# Patient Record
Sex: Male | Born: 1965 | Race: White | Hispanic: No | Marital: Single | Smoking: Never smoker
Health system: Southern US, Community
[De-identification: ages and names within clinical notes are randomized; demographics above are authoritative.]

---

## 2014-05-31 ENCOUNTER — Emergency Department (HOSPITAL_COMMUNITY): Payer: Managed Care, Other (non HMO)

## 2014-05-31 ENCOUNTER — Encounter (HOSPITAL_COMMUNITY): Payer: Self-pay | Admitting: Emergency Medicine

## 2014-05-31 ENCOUNTER — Emergency Department (HOSPITAL_COMMUNITY)
Admission: EM | Admit: 2014-05-31 | Discharge: 2014-05-31 | Disposition: A | Discharge status: Home / Self Care | Payer: Managed Care, Other (non HMO) | Attending: Emergency Medicine | Admitting: Emergency Medicine

## 2014-05-31 DIAGNOSIS — R0602 Shortness of breath: Secondary | ICD-10-CM | POA: Insufficient documentation

## 2014-05-31 DIAGNOSIS — R0989 Other specified symptoms and signs involving the circulatory and respiratory systems: Secondary | ICD-10-CM | POA: Insufficient documentation

## 2014-05-31 DIAGNOSIS — R209 Unspecified disturbances of skin sensation: Secondary | ICD-10-CM | POA: Insufficient documentation

## 2014-05-31 DIAGNOSIS — R0789 Other chest pain: Secondary | ICD-10-CM | POA: Insufficient documentation

## 2014-05-31 DIAGNOSIS — R0609 Other forms of dyspnea: Secondary | ICD-10-CM | POA: Insufficient documentation

## 2014-05-31 DIAGNOSIS — R202 Paresthesia of skin: Secondary | ICD-10-CM

## 2014-05-31 DIAGNOSIS — Z7982 Long term (current) use of aspirin: Secondary | ICD-10-CM | POA: Insufficient documentation

## 2014-05-31 LAB — BASIC METABOLIC PANEL
ANION GAP: 13 (ref 5–15)
BUN: 12 mg/dL (ref 6–23)
CHLORIDE: 102 meq/L (ref 96–112)
CO2: 27 meq/L (ref 19–32)
Calcium: 9 mg/dL (ref 8.4–10.5)
Creatinine, Ser: 0.82 mg/dL (ref 0.50–1.35)
GFR calc Af Amer: >90 mL/min (ref >90)
GFR calc non Af Amer: >90 mL/min (ref >90)
Glucose, Bld: 95 mg/dL (ref 70–99)
Potassium: 3.5 mEq/L — ABNORMAL LOW (ref 3.7–5.3)
SODIUM: 142 meq/L (ref 137–147)

## 2014-05-31 LAB — CBC
HCT: 40.5 % (ref 39.0–52.0)
HEMOGLOBIN: 14 g/dL (ref 13.0–17.0)
MCH: 30.3 pg (ref 26.0–34.0)
MCHC: 34.6 g/dL (ref 30.0–36.0)
MCV: 87.7 fL (ref 78.0–100.0)
PLATELETS: 176 10*3/uL (ref 150–400)
RBC: 4.62 MIL/uL (ref 4.22–5.81)
RDW: 13.2 % (ref 11.5–15.5)
WBC: 5.4 10*3/uL (ref 4.0–10.5)

## 2014-05-31 LAB — I-STAT TROPONIN, ED
TROPONIN I, POC: 0 ng/mL (ref 0.00–0.08)
TROPONIN I, POC: 0 ng/mL (ref 0.00–0.08)

## 2014-05-31 LAB — PRO B NATRIURETIC PEPTIDE: PRO B NATRI PEPTIDE: 28.4 pg/mL (ref 0–125)

## 2014-05-31 NOTE — ED Notes (Signed)
Patient ambulatory with no apparent difficulty

## 2014-05-31 NOTE — ED Notes (Signed)
He states for the past 10 days hes had intermittent CP, trouble breathing, and "pins and needles tingling in my arms." He scheduled appt to see cardiologist next week but states he is worried because he continues to have the symptoms

## 2014-05-31 NOTE — ED Notes (Addendum)
2 weeks, patient thought he was having heart attack, sweating/ chills.  Drove to ER, pain had stopped by arrival so did not seek care.  Over the past 2 weeks intermittent chest pain and sob.  Last night woke up with sob, pins and needles in left arm, with chest pain - mid/left sternum.  Lasted 4 hours.

## 2014-05-31 NOTE — ED Notes (Signed)
PA student at bedside.

## 2014-05-31 NOTE — Discharge Instructions (Signed)
Please continue taking your daily aspirin. Avoid exertional exercise until you see the cardiologist. Your caregiver has diagnosed you as having chest pain that is not specific for one problem, but does not require admission.  You are at low risk for an acute heart condition or other serious illness. Chest pain comes from many different causes.  SEEK IMMEDIATE MEDICAL ATTENTION IF: You have severe chest pain, especially if the pain is crushing or pressure-like and spreads to the arms, back, neck, or jaw, or if you have sweating, nausea (feeling sick to your stomach), or shortness of breath. THIS IS AN EMERGENCY. Don't wait to see if the pain will go away. Get medical help at once. Call 911 or 0 (operator). DO NOT drive yourself to the hospital.  Your chest pain gets worse and does not go away with rest.  You have an attack of chest pain lasting longer than usual, despite rest and treatment with the medications your caregiver has prescribed.  You wake from sleep with chest pain or shortness of breath.  You feel dizzy or faint.  You have chest pain not typical of your usual pain for which you originally saw your caregiver.   Chest Pain (Nonspecific) It is often hard to give a specific diagnosis for the cause of chest pain. There is always a chance that your pain could be related to something serious, such as a heart attack or a blood clot in the lungs. You need to follow up with your health care provider for further evaluation. CAUSES   Heartburn.  Pneumonia or bronchitis.  Anxiety or stress.  Inflammation around your heart (pericarditis) or lung (pleuritis or pleurisy).  A blood clot in the lung.  A collapsed lung (pneumothorax). It can develop suddenly on its own (spontaneous pneumothorax) or from trauma to the chest.  Shingles infection (herpes zoster virus). The chest wall is composed of bones, muscles, and cartilage. Any of these can be the source of the pain.  The bones can be  bruised by injury.  The muscles or cartilage can be strained by coughing or overwork.  The cartilage can be affected by inflammation and become sore (costochondritis). DIAGNOSIS  Lab tests or other studies may be needed to find the cause of your pain. Your health care provider may have you take a test called an ambulatory electrocardiogram (ECG). An ECG records your heartbeat patterns over a 24-hour period. You may also have other tests, such as:  Transthoracic echocardiogram (TTE). During echocardiography, sound waves are used to evaluate how blood flows through your heart.  Transesophageal echocardiogram (TEE).  Cardiac monitoring. This allows your health care provider to monitor your heart rate and rhythm in real time.  Holter monitor. This is a portable device that records your heartbeat and can help diagnose heart arrhythmias. It allows your health care provider to track your heart activity for several days, if needed.  Stress tests by exercise or by giving medicine that makes the heart beat faster. TREATMENT   Treatment depends on what may be causing your chest pain. Treatment may include:  Acid blockers for heartburn.  Anti-inflammatory medicine.  Pain medicine for inflammatory conditions.  Antibiotics if an infection is present.  You may be advised to change lifestyle habits. This includes stopping smoking and avoiding alcohol, caffeine, and chocolate.  You may be advised to keep your head raised (elevated) when sleeping. This reduces the chance of acid going backward from your stomach into your esophagus. Most of the time, nonspecific chest  pain will improve within 2-3 days with rest and mild pain medicine.  HOME CARE INSTRUCTIONS   If antibiotics were prescribed, take them as directed. Finish them even if you start to feel better.  For the next few days, avoid physical activities that bring on chest pain. Continue physical activities as directed.  Do not use any  tobacco products, including cigarettes, chewing tobacco, or electronic cigarettes.  Avoid drinking alcohol.  Only take medicine as directed by your health care provider.  Follow your health care provider's suggestions for further testing if your chest pain does not go away.  Keep any follow-up appointments you made. If you do not go to an appointment, you could develop lasting (chronic) problems with pain. If there is any problem keeping an appointment, call to reschedule. SEEK MEDICAL CARE IF:   Your chest pain does not go away, even after treatment.  You have a rash with blisters on your chest.  You have a fever. SEEK IMMEDIATE MEDICAL CARE IF:   You have increased chest pain or pain that spreads to your arm, neck, jaw, back, or abdomen.  You have shortness of breath.  You have an increasing cough, or you cough up blood.  You have severe back or abdominal pain.  You feel nauseous or vomit.  You have severe weakness.  You faint.  You have chills. This is an emergency. Do not wait to see if the pain will go away. Get medical help at once. Call your local emergency services (911 in [place] Do not drive yourself to the hospital. MAKE SURE YOU:   Understand these instructions.  Will watch your condition.  Will get help right away if you are not doing well or get worse. Document Released: 08/27/2005 Document Revised: 11/22/2013 Document Reviewed: 06/22/2008 [facility] Patient Information 2015 [place], [place]. This information is not intended to replace advice given to you by your health care provider. Make sure you discuss any questions you have with your health care provider.  Paresthesia Paresthesia is an abnormal burning or prickling sensation. This sensation is generally felt in the hands, arms, legs, or feet. However, it may occur in any part of the body. It is usually not painful. The feeling may be described as:  Tingling or numbness.  "Pins and needles."  Skin  crawling.  Buzzing.  Limbs "falling asleep."  Itching. Most people experience temporary (transient) paresthesia at some time in their lives. CAUSES  Paresthesia may occur when you breathe too quickly (hyperventilation). It can also occur without any apparent cause. Commonly, paresthesia occurs when pressure is placed on a nerve. The feeling quickly goes away once the pressure is removed. For some people, however, paresthesia is a long-lasting (chronic) condition caused by an underlying disorder. The underlying disorder may be:  A traumatic, direct injury to nerves. Examples include a:  Broken (fractured) neck.  Fractured skull.  A disorder affecting the brain and spinal cord (central nervous system). Examples include:  Transverse myelitis.  Encephalitis.  Transient ischemic attack.  Multiple sclerosis.  Stroke.  Tumor or blood vessel problems, such as an arteriovenous malformation pressing against the brain or spinal cord.  A condition that damages the peripheral nerves (peripheral neuropathy). Peripheral nerves are not part of the brain and spinal cord. These conditions include:  Diabetes.  Peripheral vascular disease.  Nerve entrapment syndromes, such as carpal tunnel syndrome.  Shingles.  Hypothyroidism.  Vitamin B12 deficiencies.  Alcoholism.  Heavy metal poisoning (lead, arsenic).  Rheumatoid arthritis.  Systemic lupus erythematosus.  DIAGNOSIS  Your caregiver will attempt to find the underlying cause of your paresthesia. Your caregiver may:  Take your medical history.  Perform a physical exam.  Order various lab tests.  Order imaging tests. TREATMENT  Treatment for paresthesia depends on the underlying cause. HOME CARE INSTRUCTIONS  Avoid drinking alcohol.  You may consider massage or acupuncture to help relieve your symptoms.  Keep all follow-up appointments as directed by your caregiver. SEEK IMMEDIATE MEDICAL CARE IF:   You feel  weak.  You have trouble walking or moving.  You have problems with speech or vision.  You feel confused.  You cannot control your bladder or bowel movements.  You feel numbness after an injury.  You faint.  Your burning or prickling feeling gets worse when walking.  You have pain, cramps, or dizziness.  You develop a rash. MAKE SURE YOU:  Understand these instructions.  Will watch your condition.  Will get help right away if you are not doing well or get worse. Document Released: 11/07/2002 Document Revised: 02/09/2012 Document Reviewed: 08/08/2011 [facility] Patient Information 2015 [place]. This information is not intended to replace advice given to you by your health care provider. Make sure you discuss any questions you have with your health care provider.

## 2014-05-31 NOTE — ED Provider Notes (Signed)
CSN: [id number]     Arrival date & time 05/31/14  1433 History   First MD Initiated Contact with Patient 05/31/14 1819     Chief Complaint  Patient presents with  . Chest Pain    George Rosales is a 48 y.o. male who [facility] with the chief complaint of Chest pressure and SOB.  The chest pressure started two weeks ago. He had an episode at work lasting one hour with  associated with hot and cold sweats and dyspnea at rest. He went to an urgent cae, but his sxs resolved and he left before being seen.   [facility] as he  Has had continued chest pressure that is fleeting, lasting about 10 seconds at a time with no associated sxs.  His PCP was concerned about LVH and referred him to a cardiologist. [place]. The patient then had another episode of chest tightness last night around 12:00AM that woke him form sleep. He describes the episode of chest pressure as sudden onset and feels like a band around his chest.. He has occasional sharp chest pain that is fleeting.   He then states he had left hand and forearm paresthesia for the next 8 hours involving all of the digits..  Symptons have not returned from this morning.  No notable PMH. No other cardio risk factors.  He currently takes 1 Asprin daily. Patient is otherwise very active and runs 5 kilometers daily and has recently increased his pushups.  Patient is a 48 y.o. male presenting with chest pain.  Chest Pain Associated symptoms: shortness of breath   Associated symptoms: no abdominal pain, no back pain, no cough, no fever, no headache, no nausea, no palpitations and not vomiting     History reviewed. No pertinent past medical history. History reviewed. No pertinent past surgical history. History reviewed. No pertinent family history. History  Substance Use Topics  . Smoking status: Never Smoker   . Smokeless tobacco: Not on file  . Alcohol  Use: No    Review of Systems  Constitutional: Negative for fever and chills.  Respiratory: Positive for chest tightness and shortness of breath. Negative for cough, choking and wheezing.   Cardiovascular: Positive for chest pain. Negative for palpitations and leg swelling.  Gastrointestinal: Negative for nausea, vomiting, abdominal pain, diarrhea and constipation.  Genitourinary: Negative for dysuria, urgency and frequency.  Musculoskeletal: Negative for arthralgias, back pain, myalgias and neck pain.  Skin: Negative for rash.  Neurological: Negative for headaches.  All other systems reviewed and are negative.     Allergies  Review of patient's allergies indicates no known allergies.  Home Medications   Prior to Admission medications   Medication Sig Start Date End Date Taking? Authorizing Provider  aspirin 81 MG tablet Take 81 mg by mouth daily.   Yes Historical Provider, MD  bismuth subsalicylate (PEPTO BISMOL) 262 MG/15ML suspension Take 30 mLs by mouth every 6 (six) hours as needed for indigestion.   Yes Historical Provider, MD  DM-Doxylamine-Acetaminophen (NYQUIL COLD & FLU PO) Take 1 capsule by mouth at bedtime as needed (sleeping).   Yes Historical Provider, MD   BP 150/102  Pulse 49  Temp(Src) 97.4 F (36.3 C) (Oral)  Resp 15  Ht 5\' 7"  (1.702 m)  Wt 160 lb (72.576 kg)  BMI 25.05 kg/m2  SpO2 100% Physical Exam  Nursing note and vitals reviewed. Constitutional: He appears well-developed  and well-nourished. No distress.  HENT:  Head: Normocephalic and atraumatic.  Eyes: Conjunctivae are normal. No scleral icterus.  Neck: Normal range of motion. Neck supple.  Cardiovascular: Normal rate, regular rhythm and intact distal pulses.  Exam reveals no gallop and no friction rub.   No murmur heard. Pulmonary/Chest: Effort normal and breath sounds normal. No respiratory distress. He exhibits no tenderness.  Abdominal: Soft. There is no tenderness.  Musculoskeletal: He  exhibits no edema.  Neurological: He is alert.  Skin: Skin is warm and dry. He is not diaphoretic.  Psychiatric: His behavior is normal.    ED Course  Procedures (including critical care time) Labs Review Labs Reviewed  BASIC METABOLIC PANEL - Abnormal; Notable for the following:    Potassium 3.5 (*)    All other components within normal limits  CBC  PRO B NATRIURETIC PEPTIDE  I-STAT TROPOININ, ED  Rosezena SensorI-STAT TROPOININ, ED    Imaging Review Dg Chest 2 View  05/31/2014   CLINICAL DATA:  Chest pain and difficulty breathing  EXAM: CHEST  2 VIEW  COMPARISON:  None.  FINDINGS: Lungs are clear. Heart size and pulmonary vascularity are normal. No pneumothorax. No adenopathy. No bone lesions.  IMPRESSION: No abnormality noted.   Electronically Signed   By: Bretta BangWilliam  Woodruff M.D.   On: 05/31/2014 15:16     EKG Interpretation   Date/Time:  Wednesday May 31 2014 14:36:47 EDT Ventricular Rate:  54 PR Interval:  160 QRS Duration: 92 QT Interval:  430 QTC Calculation: 407 R Axis:   65 Text Interpretation:  Sinus bradycardia Possible Left atrial enlargement  Left ventricular hypertrophy Nonspecific T wave abnormality Abnormal ECG  Confirmed by POLLINA  MD, CHRISTOPHER [phone]([id number]) on 05/31/2014 7:57:55 PM      MDM   Final diagnoses:  Chest tightness or pressure  Right hand paresthesia    8:55 PM BP 116/71  Pulse 48  Temp(Src) 97.4 F (36.3 C) (Oral)  Resp 18  Ht 5\' 7"  (1.702 m)  Wt 160 lb (72.576 kg)  BMI 25.05 kg/m2  SpO2 97% Patient seen ins shared visit with Dr. Blinda LeatherwoodPollina.  No chest pressure, sob, n/v.  EKG unremarkable. 2 negative troponins here in the ED  HEART score 1. ACS unlikely . No pleurisy, hemoptysis, desaturations and PE highly doubtful. Exam in ED reassuring.   Patient is to be discharged with recommendation to follow up with PCP in regards to today's hospital visit. Chest pain is not likely of cardiac or pulmonary etiology d/t presentation, perc negative, VSS, no  tracheal deviation, no JVD or new murmur, RRR, breath sounds equal bilaterally, EKG without acute abnormalities, negative troponin, and negative CXR. Pt has  to the ED if sxs becomes exertional, associated with diaphoresis or nausea, or if sxs worsen or becomes concerning in any way. Pt appears reliable for follow up and is agreeable to discharge.        Arthor Captainbigail Ahlana Slaydon, PA-C 06/02/14 1151

## 2014-06-03 NOTE — ED Provider Notes (Signed)
Medical screening examination/treatment/procedure(s) were conducted as a shared visit with non-physician practitioner(s) and myself.  I personally evaluated the patient during the encounter.   EKG Interpretation   Date/Time:  Wednesday May 31 2014 14:36:47 EDT Ventricular Rate:  54 PR Interval:  160 QRS Duration: 92 QT Interval:  430 QTC Calculation: 407 R Axis:   65 Text Interpretation:  Sinus bradycardia Possible Left atrial enlargement  Left ventricular hypertrophy Nonspecific T wave abnormality Abnormal ECG  Confirmed by POLLINA  MD, CHRISTOPHER ([id number]) on 05/31/2014 7:57:55 PM      Presented with chest pain. Patient's pain was atypical, it occurred contrast. Patient is a runner, runs many miles per week and never has exertional chest pain. It is felt that after 2 negative troponins, normal EKG, he is appropriate for outpatient followup. He has already scheduled followup with cardiology for next week. Patient counseled to return for any recurrent pain.  Gilda Creasehristopher J. Pollina, MD 06/03/14 [phone]

## 2015-07-19 IMAGING — CR DG CHEST 2V
2 series · 2 of 2 positions shown · non-contrast
Comparison: None.

CLINICAL DATA: Chest pain and difficulty breathing

EXAM:
CHEST  2 VIEW

[w chest pa]
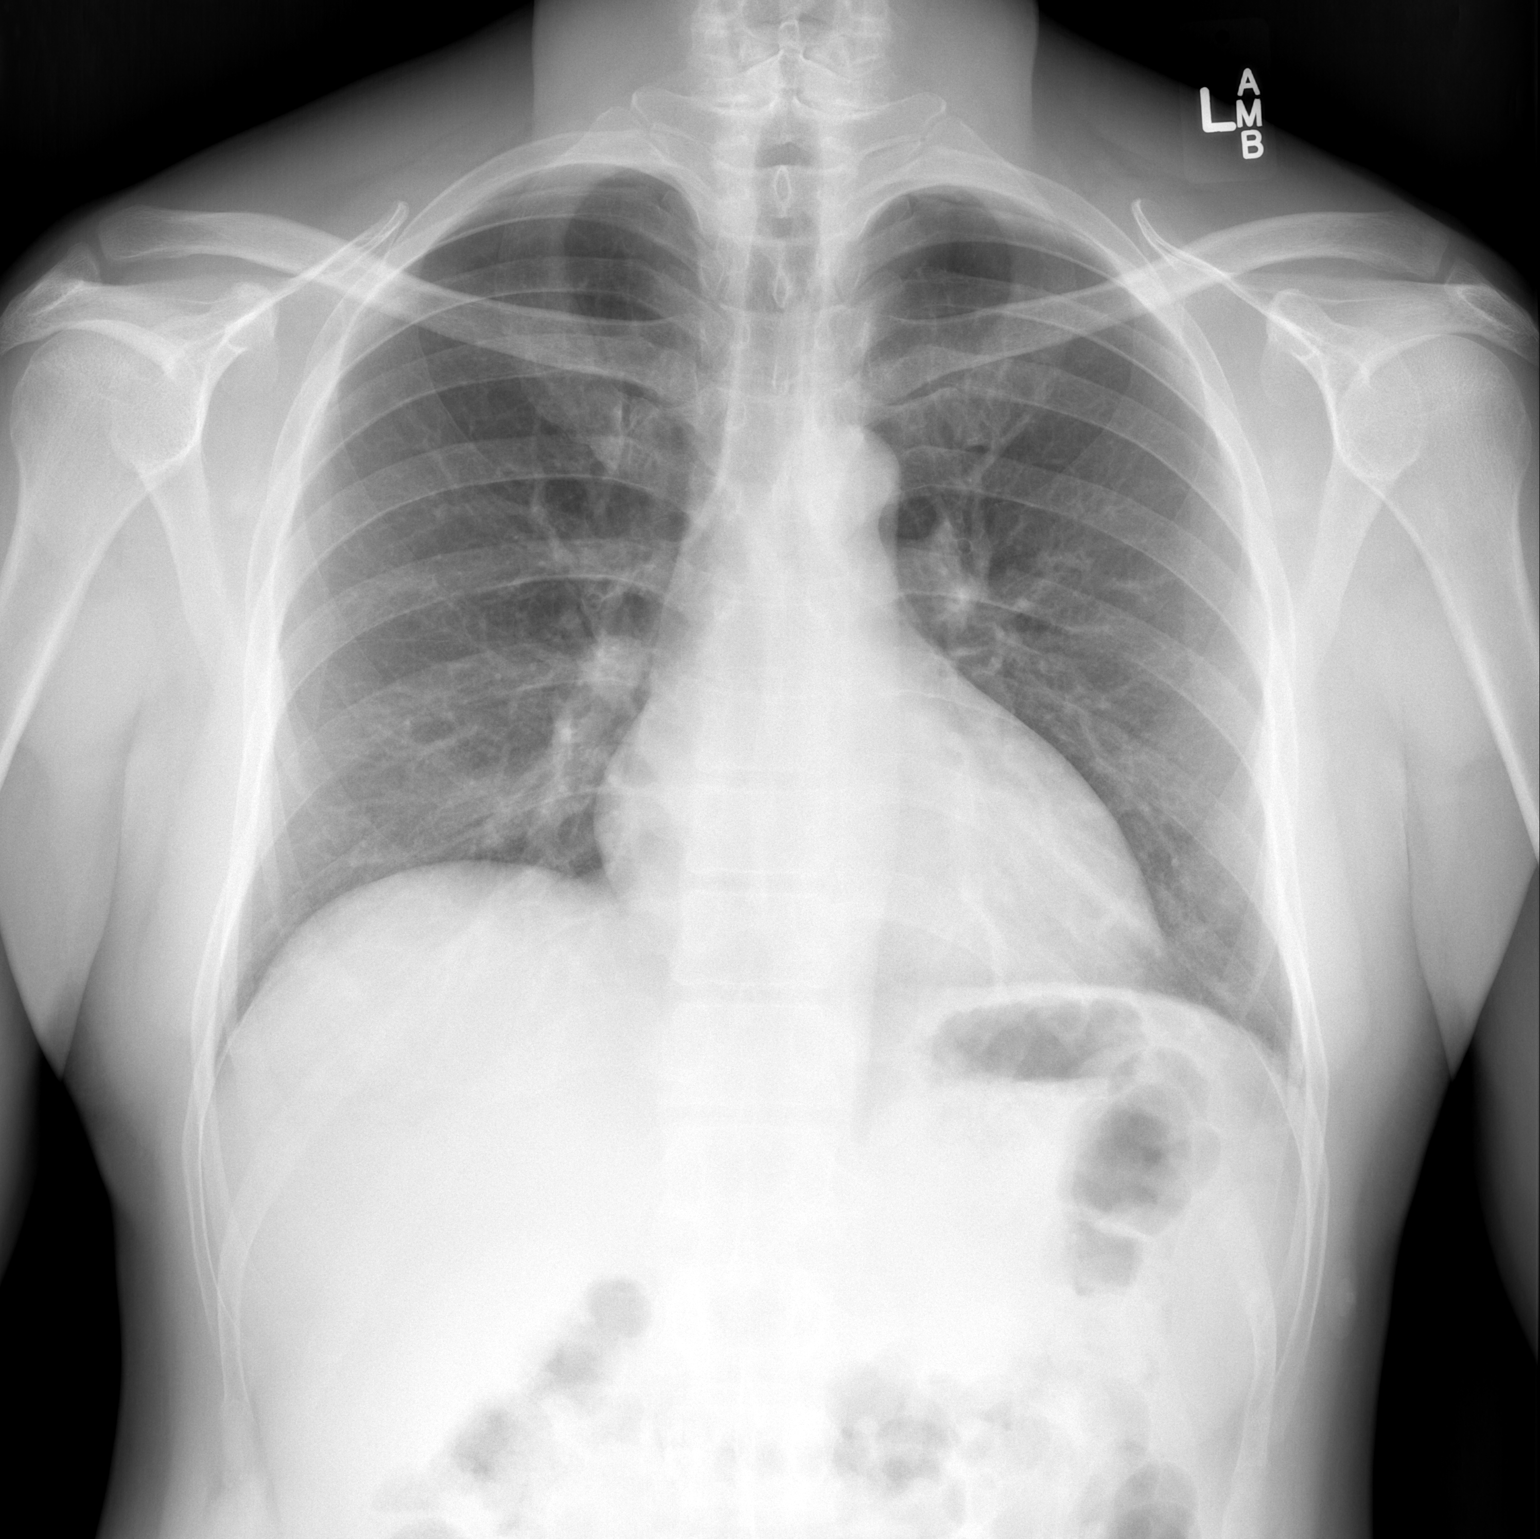

[w chest lat]
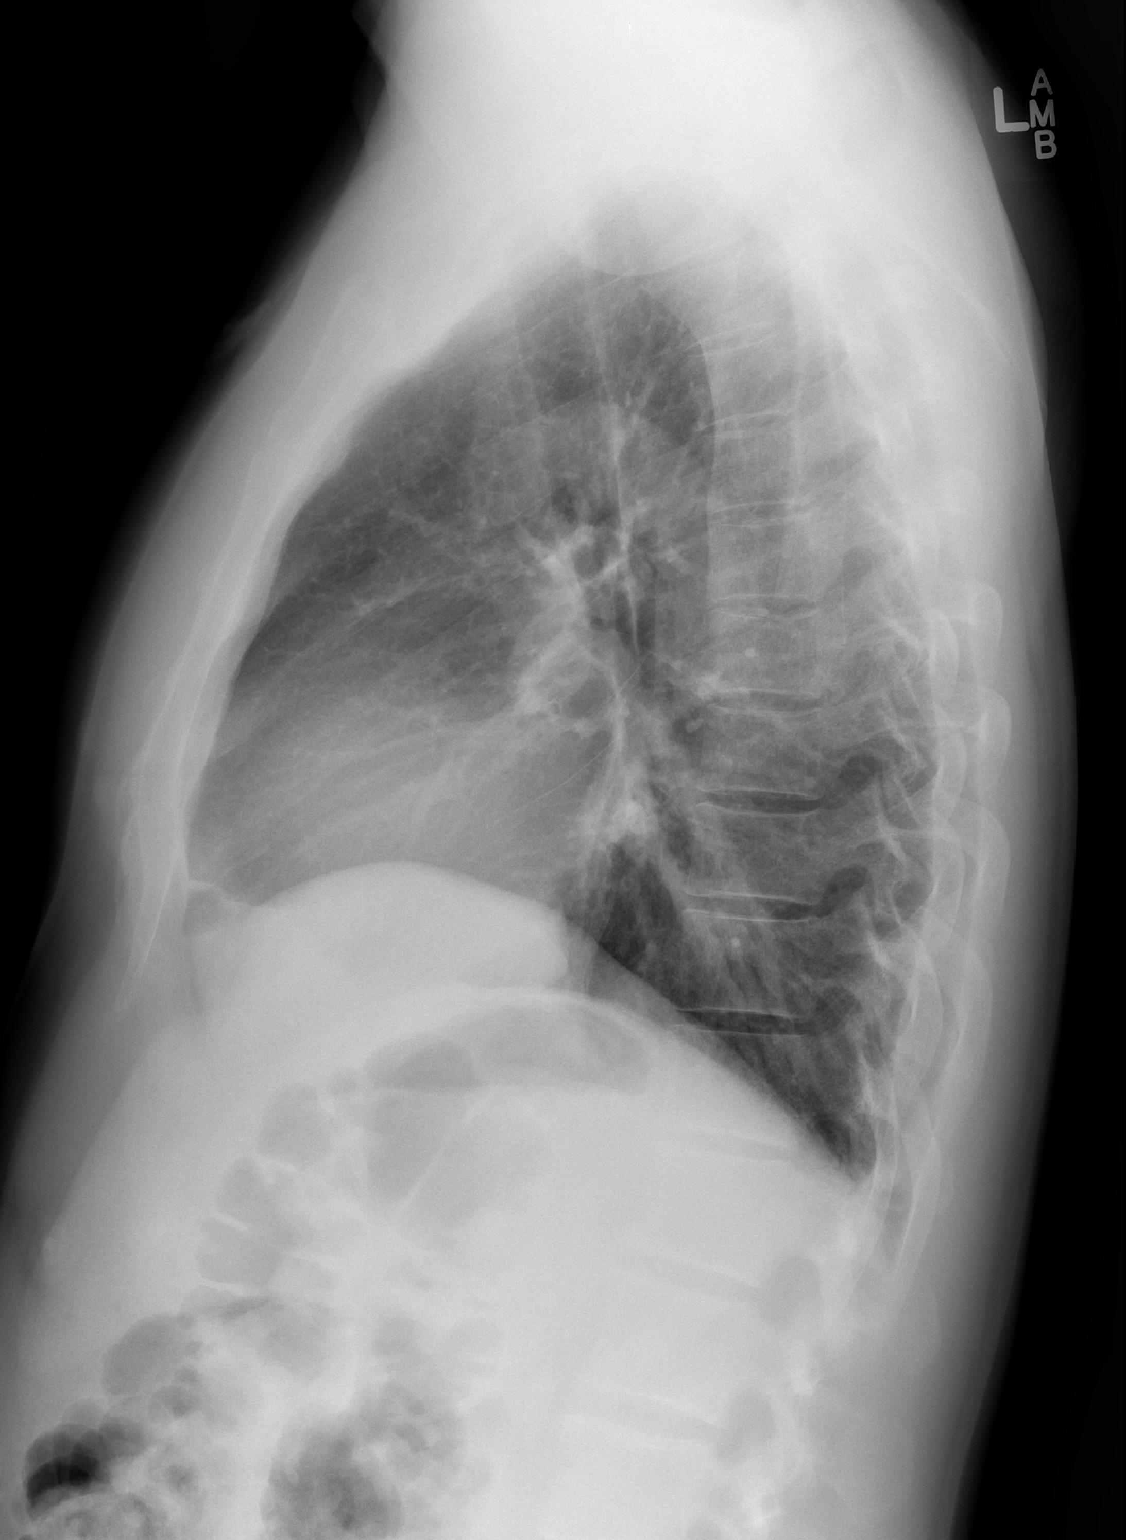

[2 of 2 positions shown; findings below may reference images not displayed]

FINDINGS: Lungs are clear. Heart size and pulmonary vascularity are normal. No
pneumothorax. No adenopathy. No bone lesions.
IMPRESSION: No abnormality noted.

## 2018-03-22 ENCOUNTER — Ambulatory Visit: Payer: Managed Care, Other (non HMO) | Admitting: Sports Medicine

## 2018-03-31 ENCOUNTER — Ambulatory Visit (INDEPENDENT_AMBULATORY_CARE_PROVIDER_SITE_OTHER): Payer: Managed Care, Other (non HMO) | Admitting: Family Medicine

## 2018-03-31 ENCOUNTER — Encounter: Payer: Self-pay | Admitting: Family Medicine

## 2018-03-31 VITALS — BP 122/86 | Ht 67.0 in | Wt 162.0 lb

## 2018-03-31 DIAGNOSIS — R269 Unspecified abnormalities of gait and mobility: Secondary | ICD-10-CM | POA: Diagnosis not present

## 2018-03-31 NOTE — Progress Notes (Signed)
Chief complaint: Requesting custom orthotics to be made today  History of present illness: George Rosales is a 52 year old male who presents to sports medicine office today with with request to have custom orthotics be made for him today.  He was kindly referred here by Dr. Madelon Lips at [facility] orth[facility].  He was seen by Dr. Madelon Lips about 3 weeks ago on 03/11/2018 for multiple issues related to right knee pain, right great toe pain, left foot for pain, heel pain, and ankle pain.  He was ultimately found to have pronated feet with calcaneal valgus.  Dr. Madelon Lips thought ankle pain was related to posterior tibial tendinitis and plantar fasciitis.  His recommendation was to have custom orthotics be made to help out with the posterior tibial tendinitis and plantar fasciitis.  He reports mainly wanting orthotics to be placed in his dress shoes today.  He reports mainly noticing pain in the medial ankle as he is on his feet during the day. 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  He does not report of any ankle swelling, numbness, tingling, burning paresthesias.  He is an avid runner.  Fortunately, he does not have much in way of issues with pain when he runs.  He was given a prescription for meloxicam by Dr. Madelon Lips at last appointment.  He has been using this with some improvement in his symptoms.   Review of systems:  As stated above  His past medical history, surgical history, family history, and social history obtained and reviewed.  He is otherwise healthy, no medical conditions; does not report of any surgeries; he does not report of any current tobacco use; family history negative for hypertension or type 2 diabetes; allergies and medications are reviewed and are reflected in EMR.  Physical exam: Vital signs are reviewed and are documented in the chart Gen.: Alert, oriented, appears stated age, in no apparent distress HEENT: Moist oral mucosa Respiratory: Normal respirations, able to speak in  full sentences Cardiac: Regular rate, distal pulses 2+ Integumentary: No rashes on visible skin:  Neurologic: Strength 5/5, sensation 2+ bilateral feet and ankles Psych: Normal affect, mood is described as good Musculoskeletal: Inspection of both of his feet reveal no obvious deformity or muscle atrophy, no warmth, erythema, ecchymosis, or effusion, he does have full ankle range of motion and strength , it does appear that he does have a more pes planus foot with pronation at rest, more noticeable with ambulation, does have bilateral hallux rigidus but no tenderness to palpation at the IP or MTP, he is more of a midfoot striker  Assessment and plan: 1. Gait abnormality, with bilateral pes planus and dynamic pronation 2. Bilateral posterior tibialis tendinitis managed by Dr. Madelon Lips 3. Bilateral plantar fasciitis managed by Dr. Madelon Lips  Plan: Custom orthotics were made for [place] today. There appeared to be a misunderstanding as his goal was to get orthotics for his dress shoes, but he was told to bring his running shoes. He does not have his dress shoes today. Did create custom orthotics to be based off that. He will schedule future appt for orthotics in his running shoes.  Patient was fitted for a : standard, cushioned, semi-rigid orthotic. The orthotic was heated and afterward the patient stood on the orthotic blank positioned on the orthotic stand. The patient was positioned in subtalar neutral position and 10 degrees of ankle dorsiflexion in a weight bearing stance. After completion of molding, a stable base was applied to the orthotic blank. The  blank was ground to a stable position for weight bearing. Size: 11 Base: Dress Shoes Archivist) Posting: None Additional orthotic padding: None  Total of 30 minutes was spent with the patient with greater than 50% of the time spent in face-to-face consultation discussing orthotic construction, instruction, and fitting. Patient found the  orthotic to be comfortable prior to leaving the office.   George Rosales, M.D. Primary Care Sports Medicine Fellow [facility]

## 2018-04-07 ENCOUNTER — Encounter: Payer: Self-pay | Admitting: Family Medicine

## 2018-04-07 ENCOUNTER — Ambulatory Visit (INDEPENDENT_AMBULATORY_CARE_PROVIDER_SITE_OTHER): Payer: Managed Care, Other (non HMO) | Admitting: Family Medicine

## 2018-04-07 VITALS — BP 122/70 | Ht 67.0 in | Wt 162.0 lb

## 2018-04-07 DIAGNOSIS — R269 Unspecified abnormalities of gait and mobility: Secondary | ICD-10-CM | POA: Diagnosis not present

## 2018-04-07 NOTE — Progress Notes (Signed)
Chief complaint: Requesting second pair of custom orthotics to be made for his running shoes  History of present illness: George Rosales is a 52 year old male who presents to sports medicine office today with request to have a second pair of custom orthotics made for him today.  He was kindly referred here by Dr. Madelon Lips at [facility] orthopedics last week to have custom orthotics made.  Custom orthotics were made last week for his dress shoes.  He reports of much improvement in foot pain today when he has to wear the dress shoes while at work.  He reports that he would like a second pair of custom orthotics to be made. He notes occasionally when he runs he will notice pain then.  He initially saw Dr. Madelon Lips on 03/11/2018 for multiple issues related to right knee pain, right great toe pain, left foot pain, heel pain, and ankle pain.  Dr. Madelon Lips was concerned that his ankle pain was related to posterior tibial tendinitis and plantar fasciitis and that he would benefit from custom orthotics.  He does not report of any interval injury or trauma.  No numbness, tingling, or burning paresthesias.  He does not report of any swelling, locking, or any other mechanical symptoms in his ankles or feet.  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  Review of systems:  As stated above  His past medical history, surgical history, family history, and social history obtained and reviewed.  He is otherwise healthy, no medical conditions; does not report of any surgeries; he does not report of any current tobacco use; family history negative for hypertension or type 2 diabetes; allergies and medications are reviewed and are reflected in EMR.   Physical exam: Vital signs are reviewed and are documented in the chart Gen.: Alert, oriented, appears stated age, in no apparent distress HEENT: Moist oral mucosa Respiratory: Normal respirations, able to speak in full sentences Cardiac: Regular rate, distal pulses  2+ Integumentary: No rashes on visible skin:  Neurologic: Strength 5/5 in bilateral feet and ankles, sensation 2+ in bilateral feet and ankles Psych: Normal affect, mood is described as good Musculoskeletal: Essentially his physical exam is unchanged since last office visit, no warmth, erythema, ecchymosis, or effusion noted in bilateral feet and ankle, no obvious deformity or muscle atrophy noted, he does have full ankle range of motion and strength, on gait evaluation he does have pes planus with dynamic pronation at rest, this is more noticeable with ambulation, does have bilateral hallux rigidus, on running evaluation he is a midfoot striker  Patient was fitted for a : standard, cushioned, semi-rigid orthotic. The orthotic was heated and afterward the patient stood on the orthotic blank positioned on the orthotic stand. The patient was positioned in subtalar neutral position and 10 degrees of ankle dorsiflexion in a weight bearing stance. After completion of molding, a stable base was applied to the orthotic blank. The blank was ground to a stable position for weight bearing. Size: 11 Base: Blue EVA Posting: None Additional orthotic padding: None  Assessment and plan: 1. Gait abnormality, with bilateral pes planus and dynamic pronation 2. Bilateral posterior tibialis tendinitis managed by Dr. Madelon Lips 3. Bilateral plantar fasciitis managed by Dr. Madelon Lips  Plan: Second pair of custom orthotics were made for [name] today.  This was more of the standard custom orthotics that we typically make.  This was fitted into his running shoes with satisfaction noted.  Discussed for his other foot and ankle pain follow-up as  needed with Dr. Madelon Lips.  I would be happy to see him for any other issues.  He will otherwise follow-up on as-needed basis.    Haynes Kerns, MD Primary Care Sports Medicine Fellow [facility]

## 2019-06-08 ENCOUNTER — Other Ambulatory Visit: Payer: Self-pay | Admitting: *Deleted

## 2019-06-08 DIAGNOSIS — Z20822 Contact with and (suspected) exposure to covid-19: Secondary | ICD-10-CM

## 2019-06-14 LAB — NOVEL CORONAVIRUS, NAA: SARS-CoV-2, NAA: NOT DETECTED

## 2019-07-14 ENCOUNTER — Ambulatory Visit
Admission: EM | Admit: 2019-07-14 | Discharge: 2019-07-14 | Disposition: A | Discharge status: Home / Self Care | Payer: Managed Care, Other (non HMO) | Attending: Physician Assistant | Admitting: Physician Assistant

## 2019-07-14 ENCOUNTER — Other Ambulatory Visit: Payer: Self-pay

## 2019-07-14 DIAGNOSIS — L237 Allergic contact dermatitis due to plants, except food: Secondary | ICD-10-CM | POA: Diagnosis not present

## 2019-07-14 MED ORDER — METHYLPREDNISOLONE SODIUM SUCC 125 MG IJ SOLR
80.0000 mg | Freq: Once | INTRAMUSCULAR | Status: AC
  Administered 2019-07-14: 13:00:00 80 mg via INTRAMUSCULAR

## 2019-07-14 NOTE — Discharge Instructions (Signed)
Solu-Medrol injection given in office today.  Continue triamcinolone, calamine lotion as discussed.  If needing to restart your steroid, you can take 5 pills at the same time (50 mg) once a day for 3 days.  Avoid soap or other irritants at this time.  Follow-up for reevaluation of symptoms not improving, worsening, signs of infection such as spreading redness, warmth, fever.

## 2019-07-14 NOTE — ED Provider Notes (Signed)
[facility]    CSN: [id number] Arrival date & time: 07/14/19  1249      History   Chief Complaint Chief Complaint  Patient presents with  . Poison Ivy    HPI George Rosales is a 53 y.o. male.   53 year old male comes in for poison ivy dermatitis, requesting steroid injection.  States was cleaning the yard 4 days ago, and has rash covering abdomen, bilateral upper extremities.  He had a tele-visit, and was given prednisone 3-day taper starting from 40 mg.  He is on 30 mg today.  States in the past, oral prednisone has not had results relieving poison ivy dermatitis.  Has had better relief with injections.  He has also been applying triamcinolone cream with minimal relief.  Denies spreading erythema, warmth, pain, fever.     History reviewed. No pertinent past medical history.  There are no active problems to display for this patient.   History reviewed. No pertinent surgical history.     Home Medications    Prior to Admission medications   Medication Sig Start Date End Date Taking? Authorizing Provider  aspirin 81 MG tablet Take 81 mg by mouth daily.    [provider]  bismuth subsalicylate (PEPTO BISMOL) 262 MG/15ML suspension Take 30 mLs by mouth every 6 (six) hours as needed for indigestion.    [provider]  DM-Doxylamine-Acetaminophen (NYQUIL COLD & FLU PO) Take 1 capsule by mouth at bedtime as needed (sleeping).    [provider]    Family History No family history on file.  Social History Social History   Tobacco Use  . Smoking status: Never Smoker  . Smokeless tobacco: Never Used  Substance Use Topics  . Alcohol use: No  . Drug use: No     Allergies   Patient has no known allergies.   Review of Systems Review of Systems  Reason unable to perform ROS: See HPI as above.     Physical Exam Triage Vital Signs ED Triage Vitals  Enc Vitals Group     BP 07/14/19 1257 (!) 156/93     Pulse Rate 07/14/19  1257 68     Resp 07/14/19 1257 18     Temp 07/14/19 1257 98.1 F (36.7 C)     Temp Source 07/14/19 1257 Oral     SpO2 07/14/19 1257 96 %     Weight --      Height --      Head Circumference --      Peak Flow --      Pain Score 07/14/19 1258 0     Pain Loc --      Pain Edu? --      Excl. in [place]? --    No data found.  Updated Vital Signs BP (!) 156/93 (BP Location: Left Arm)   Pulse 68   Temp 98.1 F (36.7 C) (Oral)   Resp 18   SpO2 96%   Physical Exam Constitutional:      General: He is not in acute distress.    Appearance: He is well-developed. He is not diaphoretic.  HENT:     Head: Normocephalic and atraumatic.  Eyes:     Conjunctiva/sclera: Conjunctivae normal.     Pupils: Pupils are equal, round, and reactive to light.  Pulmonary:     Effort: Pulmonary effort is normal. No respiratory distress.  Skin:    Comments: Large areas of erythema, swelling with bulla/vesicular rash to the abdomen, upper extremities. Weeping  at the upper abdomen. No warmth. No tenderness.   Neurological:     Mental Status: He is alert and oriented to person, place, and time.      UC Treatments / Results  Labs (all labs ordered are listed, but only abnormal results are displayed) Labs Reviewed - No data to display  EKG   Radiology No results found.  Procedures Procedures (including critical care time)  Medications Ordered in UC Medications  methylPREDNISolone sodium succinate (SOLU-MEDROL) 125 mg/2 mL injection 80 mg (80 mg Intramuscular Given 07/14/19 1324)    Initial Impression / Assessment and Plan / UC Course  I have reviewed the triage vital signs and the nursing notes.  Pertinent labs & imaging results that were available during my care of the patient were reviewed by me and considered in my medical decision making (see chart for details).    History and exam consistent with poison ivy dermatitis.  Solu-Medrol injection given in office today.  Can continue  triamcinolone and calamine lotion as discussed.  Given patient without any relief on oral prednisone, can discontinue at this time and monitor.  Return precautions given.  Patient expresses understanding and agrees to plan.  Final Clinical Impressions(s) / UC Diagnoses   Final diagnoses:  Poison ivy dermatitis    ED Prescriptions    None        Belinda FisherYu, Zniyah Midkiff V, PA-C 07/14/19 1344

## 2019-07-14 NOTE — ED Triage Notes (Signed)
Pt c/o sever poison ivy all over chest and bilateral arms since Sunday. States tx'd from tele MD with prednisone and cream with no relief

## 2019-07-18 ENCOUNTER — Encounter: Payer: Self-pay | Admitting: Emergency Medicine

## 2019-07-18 ENCOUNTER — Ambulatory Visit
Admission: EM | Admit: 2019-07-18 | Discharge: 2019-07-18 | Disposition: A | Discharge status: Home / Self Care | Payer: Managed Care, Other (non HMO) | Attending: Physician Assistant | Admitting: Physician Assistant

## 2019-07-18 ENCOUNTER — Other Ambulatory Visit: Payer: Self-pay

## 2019-07-18 DIAGNOSIS — L237 Allergic contact dermatitis due to plants, except food: Secondary | ICD-10-CM

## 2019-07-18 MED ORDER — METHYLPREDNISOLONE ACETATE 80 MG/ML IJ SUSP
80.0000 mg | Freq: Once | INTRAMUSCULAR | Status: AC
  Administered 2019-07-18: 80 mg via INTRAMUSCULAR

## 2019-07-18 NOTE — ED Provider Notes (Signed)
[facility]    CSN: [id number] Arrival date & time: 07/18/19  1027     History   Chief Complaint Chief Complaint  Patient presents with  . Rash    HPI Stephanos Fan is a 53 y.o. male.   53 year old male returns for poison ivy dermatitis after being seen 07/14/2019. At the time, he was already on oral prednisone and was given solumedrol injection. States symptoms slightly improved, but new rash is developing on the ankles/hands. He denies new contact with poison ivy/new hygiene product. He has continued to apply triamcinolone and calamine lotion. Denies spreading erythema, warmth. Denies pain, fever, chills, body aches.      History reviewed. No pertinent past medical history.  There are no active problems to display for this patient.   History reviewed. No pertinent surgical history.     Home Medications    Prior to Admission medications   Medication Sig Start Date End Date Taking? Authorizing Provider  aspirin 81 MG tablet Take 81 mg by mouth daily.    [provider]  bismuth subsalicylate (PEPTO BISMOL) 262 MG/15ML suspension Take 30 mLs by mouth every 6 (six) hours as needed for indigestion.    [provider]  DM-Doxylamine-Acetaminophen (NYQUIL COLD & FLU PO) Take 1 capsule by mouth at bedtime as needed (sleeping).    [provider]    Family History History reviewed. No pertinent family history.  Social History Social History   Tobacco Use  . Smoking status: Never Smoker  . Smokeless tobacco: Never Used  Substance Use Topics  . Alcohol use: No  . Drug use: No     Allergies   Patient has no known allergies.   Review of Systems Review of Systems  Reason unable to perform ROS: See HPI as above.     Physical Exam Triage Vital Signs ED Triage Vitals [07/18/19 1040]  Enc Vitals Group     BP (!) 153/89     Pulse Rate 88     Resp 18     Temp 97.6 F (36.4 C)     Temp Source Oral     SpO2 97 %   Weight      Height      Head Circumference      Peak Flow      Pain Score 2     Pain Loc      Pain Edu?      Excl. in [place]?    No data found.  Updated Vital Signs BP (!) 153/89 (BP Location: Right Arm)   Pulse 88   Temp 97.6 F (36.4 C) (Oral)   Resp 18   SpO2 97%   Physical Exam Constitutional:      General: He is not in acute distress.    Appearance: He is well-developed. He is not diaphoretic.  HENT:     Head: Normocephalic and atraumatic.  Eyes:     Conjunctiva/sclera: Conjunctivae normal.     Pupils: Pupils are equal, round, and reactive to light.  Pulmonary:     Effort: Pulmonary effort is normal. No respiratory distress.  Skin:    General: Skin is warm and dry.     Comments: Rash to the trunk/arm slightly improved from last visit. No longer weeping. Resolved bulla/vesicles to the abdomen and forearm. Still erythematous without warmth. Vesicular rash to the hands/ankle that is new. Mild erythema without warmth. No tenderness.  Neurological:     Mental Status: He is alert and  oriented to person, place, and time.      UC Treatments / Results  Labs (all labs ordered are listed, but only abnormal results are displayed) Labs Reviewed - No data to display  EKG   Radiology No results found.  Procedures Procedures (including critical care time)  Medications Ordered in UC Medications  methylPREDNISolone acetate (DEPO-MEDROL) injection 80 mg (80 mg Intramuscular Given 07/18/19 1059)    Initial Impression / Assessment and Plan / UC Course  I have reviewed the triage vital signs and the nursing notes.  Pertinent labs & imaging results that were available during my care of the patient were reviewed by me and considered in my medical decision making (see chart for details).    Discussed case with Dr Tracie HarrierHagler. Patient states oral prednisone has not been helpful in the past, and would like to avoid. Mild improvement of rash that was seen 1 week ago, however, with new  rash. Will provide depomedrol and continue symptomatic treatment. No signs of cellulitis today, will have patient continue to monitor. Return precautions given. Patient expresses understanding and agrees to plan.  Final Clinical Impressions(s) / UC Diagnoses   Final diagnoses:  Poison ivy dermatitis   ED Prescriptions    None        Belinda FisherYu, Naithen Rivenburg V, PA-C 07/18/19 1109

## 2019-07-18 NOTE — Discharge Instructions (Signed)
Depomedrol injection in office today. Continue triamcinolone and calamine lotion. Avoid hot water. Continue to monitor for any spreading redness, warmth, fever.

## 2019-07-18 NOTE — ED Triage Notes (Signed)
Pt here for worsening rash; seen here last week for same
